# Patient Record
Sex: Female | Born: 1970 | Race: Black or African American | Hispanic: No | Marital: Single | Smoking: Never smoker
Health system: Southern US, Community
[De-identification: ages and names within clinical notes are randomized; demographics above are authoritative.]

---

## 2002-06-13 ENCOUNTER — Observation Stay (HOSPITAL_COMMUNITY): Admission: AD | Admit: 2002-06-13 | Discharge: 2002-06-14 | Payer: Self-pay | Admitting: *Deleted

## 2002-09-13 ENCOUNTER — Ambulatory Visit (HOSPITAL_COMMUNITY): Admission: RE | Admit: 2002-09-13 | Discharge: 2002-09-13 | Payer: Self-pay | Admitting: Obstetrics & Gynecology

## 2002-09-13 ENCOUNTER — Encounter: Payer: Self-pay | Admitting: Obstetrics & Gynecology

## 2002-12-02 ENCOUNTER — Inpatient Hospital Stay (HOSPITAL_COMMUNITY): Admission: AD | Admit: 2002-12-02 | Discharge: 2002-12-02 | Payer: Self-pay | Admitting: Obstetrics & Gynecology

## 2003-01-31 ENCOUNTER — Observation Stay (HOSPITAL_COMMUNITY): Admission: AD | Admit: 2003-01-31 | Discharge: 2003-02-01 | Payer: Self-pay | Admitting: Obstetrics

## 2003-02-03 ENCOUNTER — Inpatient Hospital Stay (HOSPITAL_COMMUNITY): Admission: AD | Admit: 2003-02-03 | Discharge: 2003-02-09 | Payer: Self-pay | Admitting: Obstetrics

## 2003-02-04 ENCOUNTER — Encounter: Payer: Self-pay | Admitting: Obstetrics

## 2003-07-12 ENCOUNTER — Emergency Department (HOSPITAL_COMMUNITY): Admission: EM | Admit: 2003-07-12 | Discharge: 2003-07-12 | Payer: Self-pay | Admitting: Emergency Medicine

## 2003-11-08 ENCOUNTER — Ambulatory Visit (HOSPITAL_COMMUNITY): Admission: RE | Admit: 2003-11-08 | Discharge: 2003-11-08 | Payer: Self-pay | Admitting: Obstetrics

## 2003-11-14 ENCOUNTER — Observation Stay (HOSPITAL_COMMUNITY): Admission: EM | Admit: 2003-11-14 | Discharge: 2003-11-15 | Payer: Self-pay | Admitting: Emergency Medicine

## 2005-03-27 ENCOUNTER — Ambulatory Visit: Payer: Self-pay | Admitting: Gastroenterology

## 2005-04-02 ENCOUNTER — Ambulatory Visit: Payer: Self-pay | Admitting: Gastroenterology

## 2005-05-12 ENCOUNTER — Emergency Department (HOSPITAL_COMMUNITY): Admission: EM | Admit: 2005-05-12 | Discharge: 2005-05-13 | Payer: Self-pay | Admitting: Emergency Medicine

## 2005-11-29 IMAGING — US US TRANSVAGINAL NON-OB
1 series · 14 of 25 positions shown · non-contrast
Comparison: none

CLINICAL DATA: Assess location of IUD.  LMP 10/16/03.  G3 P3.
TRANSABDOMINAL AND TRANSVAGINAL PELVIC ULTRASOUND
Transabdominal and transvaginal scanning of the pelvis was performed.  A linear area of echogenicity is noted within the endometrium on the transabdominal scan that would be consistent with an IUD.  The uterus is difficult to see transvaginally as it is retroflexed.  It is approximately 9.5 cm in length, 4.6 cm in depth, and 6.9 cm in width.  The myometrium is fairly homogeneous.
Right ovary contains a cyst measuring 22 x 18 x 19 mm.  This has some dependent debris within it and would be consistent with a hemorrhagic cyst.  Left ovary is seen transabdominally and has a normal appearance.  No free pelvic fluid is noted.
IMPRESSION 
There is a linear echogenic area within the endometrium on the transabdominal scan that would be consistent with an IUD.
Hemorrhagic cyst in the right ovary.  Normal left ovary.

[Series 1: unknown · 0.32mm/px · 14 of 54 slices shown]
[im 1/54]
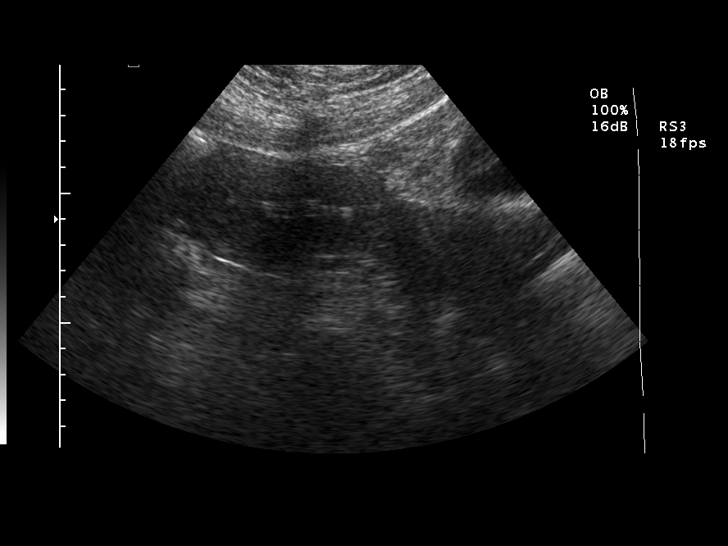
[im 5/54]
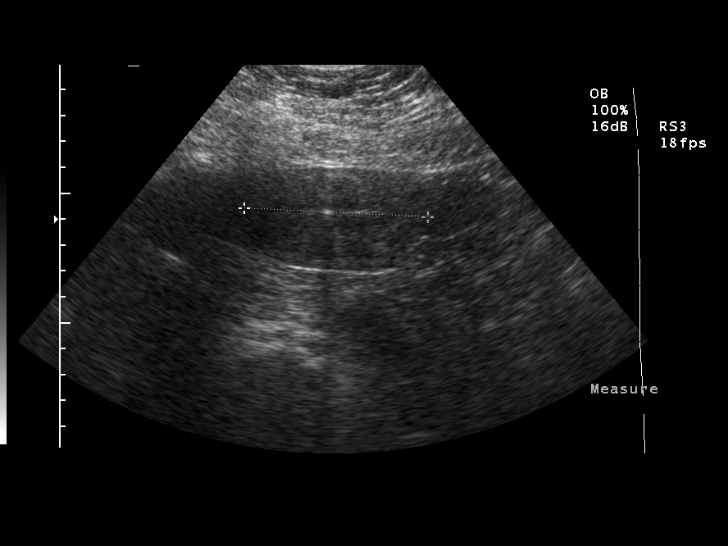
[im 9/54]
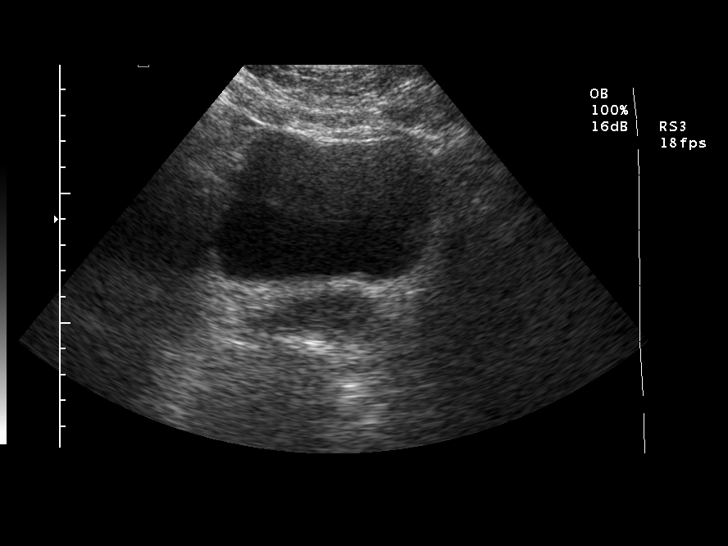
[im 14/54]
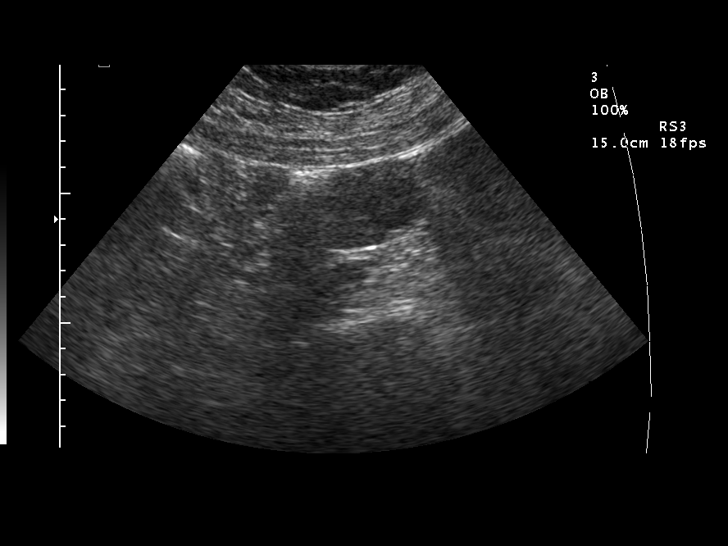
[im 18/54]
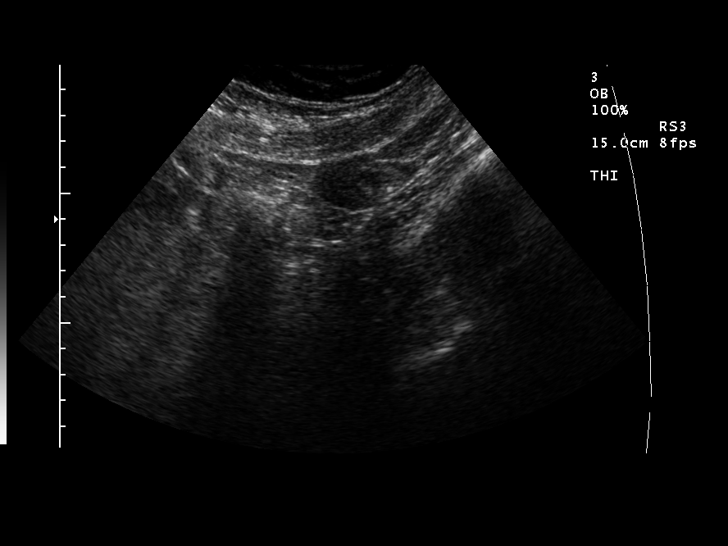
[im 20/54]
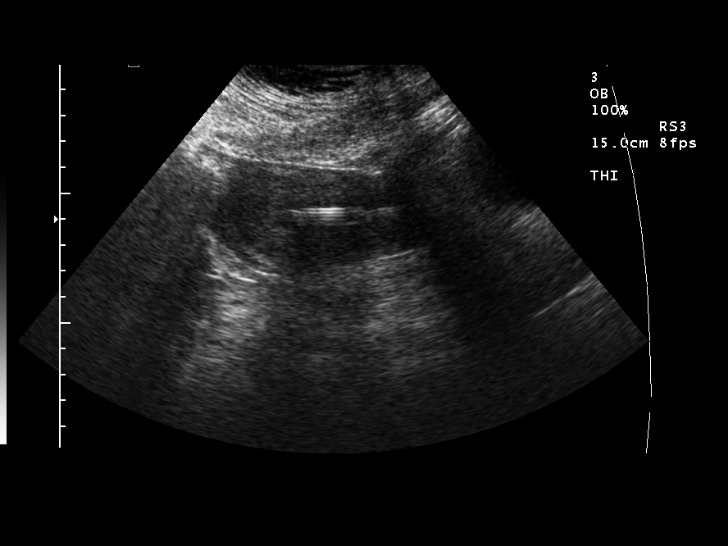
[im 25/54]
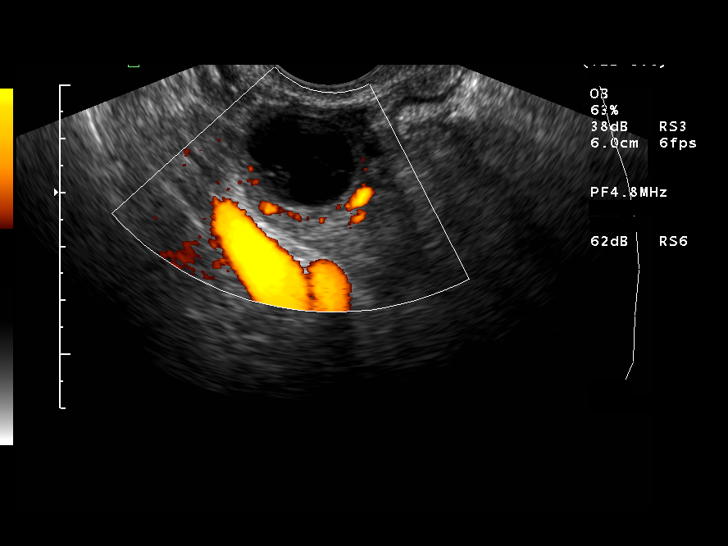
[im 29/54]
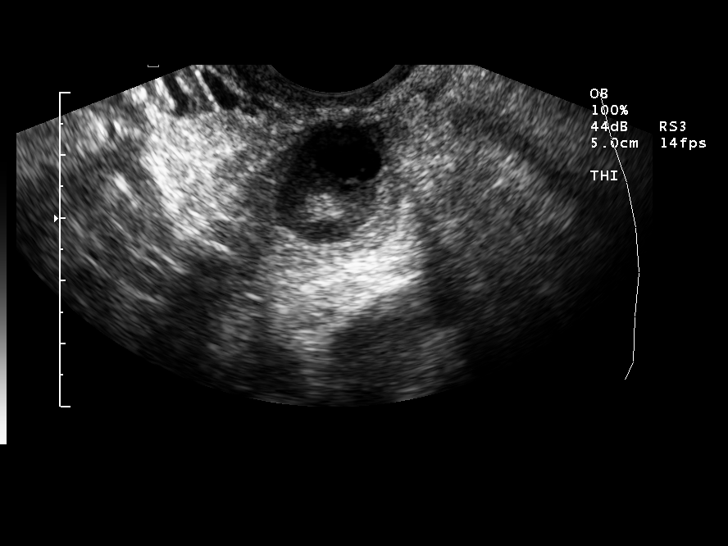
[im 34/54]
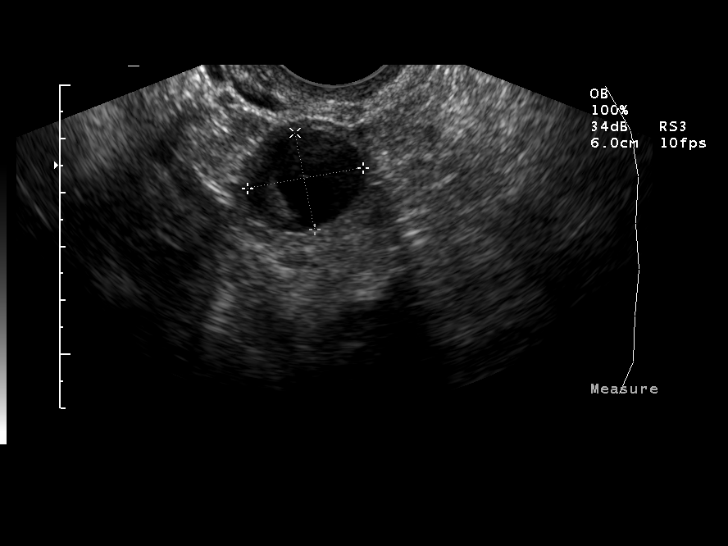
[im 36/54]
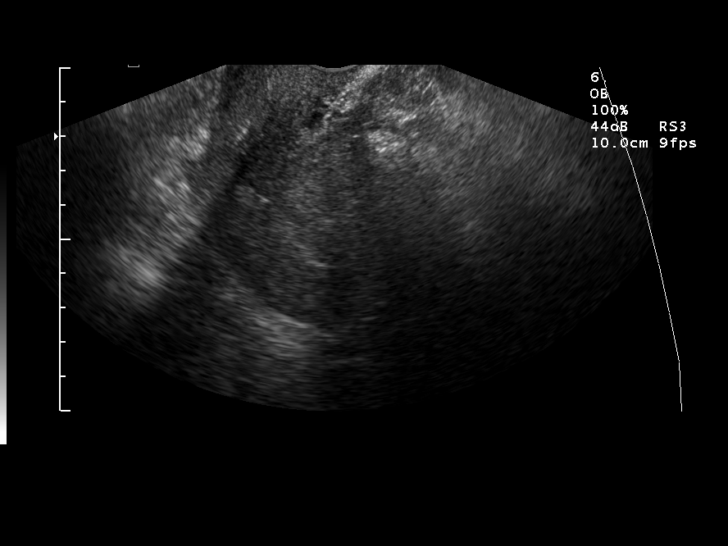
[im 40/54]
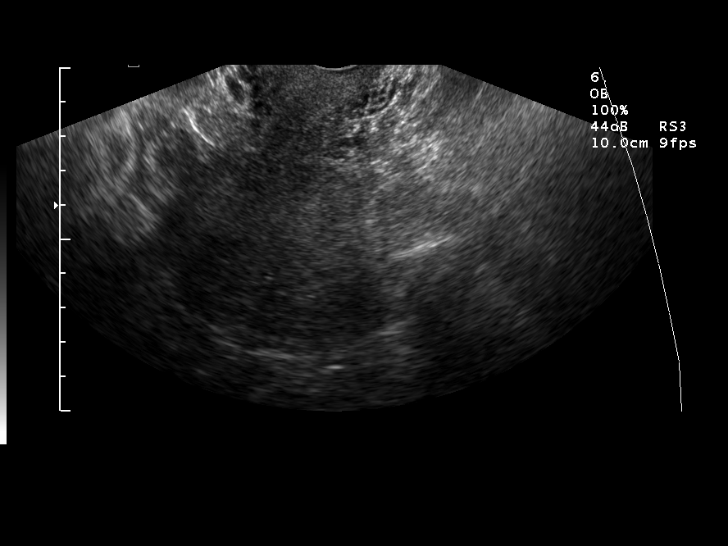
[im 45/54]
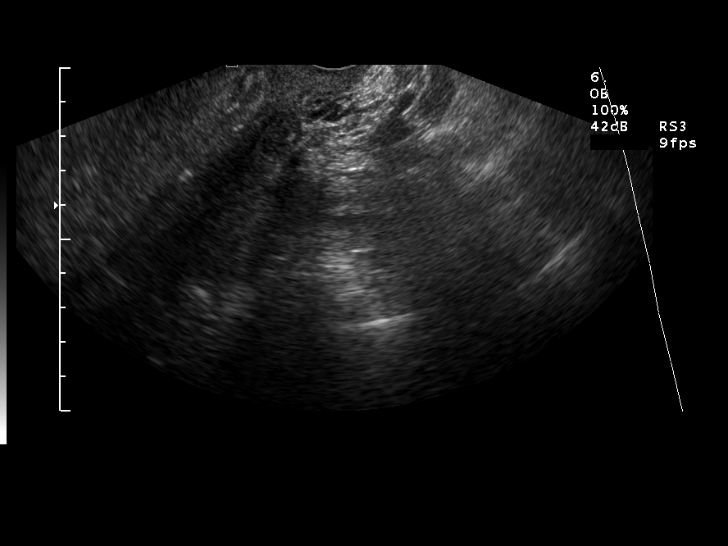
[im 49/54]
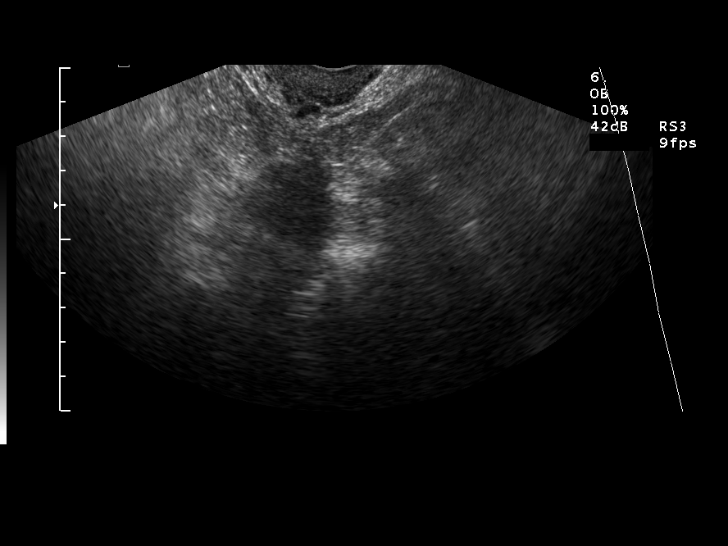
[im 54/54]
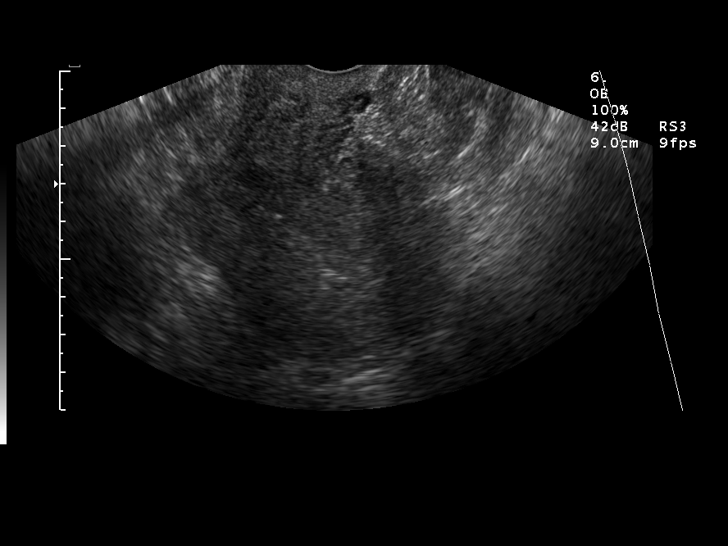

[14 of 25 positions shown; findings below may reference images not displayed]

## 2010-07-21 ENCOUNTER — Encounter: Payer: Self-pay | Admitting: Obstetrics

## 2020-09-17 ENCOUNTER — Encounter (HOSPITAL_COMMUNITY): Payer: Self-pay | Admitting: Emergency Medicine

## 2020-09-17 ENCOUNTER — Emergency Department (HOSPITAL_COMMUNITY)
Admission: EM | Admit: 2020-09-17 | Discharge: 2020-09-18 | Disposition: A | Discharge status: Home / Self Care | Payer: Self-pay | Attending: Emergency Medicine | Admitting: Emergency Medicine

## 2020-09-17 ENCOUNTER — Other Ambulatory Visit: Payer: Self-pay

## 2020-09-17 DIAGNOSIS — Y92410 Unspecified street and highway as the place of occurrence of the external cause: Secondary | ICD-10-CM | POA: Insufficient documentation

## 2020-09-17 DIAGNOSIS — Z5321 Procedure and treatment not carried out due to patient leaving prior to being seen by health care provider: Secondary | ICD-10-CM | POA: Insufficient documentation

## 2020-09-17 DIAGNOSIS — M25512 Pain in left shoulder: Secondary | ICD-10-CM | POA: Insufficient documentation

## 2020-09-17 DIAGNOSIS — M549 Dorsalgia, unspecified: Secondary | ICD-10-CM | POA: Insufficient documentation
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
## 2020-09-18 NOTE — ED Notes (Signed)
Pt called for vitals recheck x1 with no response. Patient seen leaving 30 minutes prior without returning.
